# Patient Record
Sex: Male | Born: 1970 | Race: Black or African American | Hispanic: No | Marital: Married | Smoking: Former smoker
Health system: Southern US, Community
[De-identification: ages and names within clinical notes are randomized; demographics above are authoritative.]

---

## 2002-10-02 ENCOUNTER — Encounter: Payer: Self-pay | Admitting: Family Medicine

## 2002-10-02 ENCOUNTER — Encounter: Admission: RE | Admit: 2002-10-02 | Discharge: 2002-10-02 | Payer: Self-pay | Admitting: Family Medicine

## 2006-06-10 ENCOUNTER — Inpatient Hospital Stay (HOSPITAL_COMMUNITY): Admission: EM | Admit: 2006-06-10 | Discharge: 2006-06-13 | Payer: Self-pay | Admitting: Emergency Medicine

## 2006-06-11 ENCOUNTER — Encounter (INDEPENDENT_AMBULATORY_CARE_PROVIDER_SITE_OTHER): Payer: Self-pay | Admitting: Cardiology

## 2006-06-27 ENCOUNTER — Emergency Department (HOSPITAL_COMMUNITY): Admission: EM | Admit: 2006-06-27 | Discharge: 2006-06-28 | Payer: Self-pay | Admitting: Emergency Medicine

## 2008-12-05 ENCOUNTER — Emergency Department (HOSPITAL_COMMUNITY): Admission: EM | Admit: 2008-12-05 | Discharge: 2008-12-05 | Payer: Self-pay | Admitting: Emergency Medicine

## 2009-05-05 ENCOUNTER — Emergency Department (HOSPITAL_COMMUNITY): Admission: EM | Admit: 2009-05-05 | Discharge: 2009-05-05 | Payer: Self-pay | Admitting: Emergency Medicine

## 2010-07-21 NOTE — H&P (Signed)
Damon Klein, Damon Klein              ACCOUNT NO.:  [id number]   MEDICAL RECORD NO.:  [id number]          PATIENT TYPE:  INP   LOCATION:  2919                         FACILITY:  [facility]   PHYSICIAN:  Altha Harm, MDDATE OF BIRTH:  1970-04-30   DATE OF ADMISSION:  06/10/2006  DATE OF DISCHARGE:                              HISTORY & PHYSICAL   CHIEF COMPLAINT:  Failed suicide attempt.   HISTORY OF PRESENT ILLNESS:  This is a 40 year old African-American  gentleman with a history of depression and crack cocaine use who was  found by his wife today to have ingested 48 tablets of Tylenol with  diphenhydramine at approximately 1300.  Patient was brought to the  emergency room after the family contacted the poison control center.  The patient's 4 hour level was found to be 200.  We were asked to  admitted the patient for acetomorphine toxicity.  According to the  patient's wife he was in his normal state of health prior to this.  However, the patient had been seeing a therapist with the mental health  department which he stopped seeing secondary to his crack cocaine use.   PAST MEDICAL HISTORY:  Significant for depression.   FAMILY HISTORY:  Significant for hypertension.  No history of suicide in  the family.   SOCIAL HISTORY:  Patient resides with his wife and children.  He has  cocaine use.  He smokes about 1-2 cigarettes a day.  He has no alcohol  use.   ALLERGIES:  No known drug allergies.   MEDICATIONS:  Currently he is on no medications.   PRIMARY CARE PHYSICIAN:  Patient does not have a primary care physician.   REVIEW OF SYSTEMS:  The patient is disoriented and has been unable to  provide a review of systems at this time.   LABORATORY STUDIES DONE IN THE EMERGENCY ROOM:  Sodium of 135.  Potassium of 4.7.  Chloride 112.  Bicarb 24.  BUN 10.  Creatinine 1.4.  A 4 hour acetomorphine level of 200.  Alcohol level of less than 5,  salicylate level of less than 4.  AST of  24.  ALT of 22.  Alk Phos of  39.  Bilirubin of 1.0 and a lipase of 20.  White blood cell count of  2.3.  Hemoglobin of 14.7.  Platelets of 227.   PHYSICAL EXAMINATION:  VITAL SIGNS:  On my examination the patient has a  blood pressure of 153/96.  Heart rate of 81.  Respiratory rate of 26.  O2 sats are 100% on room air.  HEENT EXAMINATION:  He is normocephalic, atraumatic.  His pupils are  equally round and reactive to light and accommodation.  Extraocular  movements are intact.  Unable to cooperative with the fundus  examination.  Oropharynx is dry.  There is no exudate erythema or  lesions noted.  Nasal mucosa shows no polyps.  NECK:  Supple.  Trachea is midline.  No masses or thyromegaly.  No JVD.  No carotid bruit.  ABDOMEN:  Is soft.  He has some mild right upper quadrant tenderness.  There is  no mass.  There is no hepatosplenomegaly.  CARDIOVASCULAR:  He is mildly tachycardic.  He has a normal  S1, S2.  No murmurs, rubs or gallops are noted.  No heaves or thrills on  palpation.  LYMPH NODE SURVEY:  He has no cervical axillary inguinal lymphadenopathy  noted.  MUSCULOSKELETAL:  He has no warmth, swelling or erythema around the  joints.  He has no spinal tenderness.  NEUROLOGICAL:  His DTRs are 2+ bilaterally in the upper and lower  extremities.  Patient is unable to cooperate with the strength  examination but he is able to move all extremities against gravity.  The  patient is also unable to cooperative with the facial examination.  He  is confused and rambling verbally.  PSYCHIATRIC:  I am unable to evaluate the patient's evaluate the  patient's psychiatric condition secondary to his mental status.   ASSESSMENT AND PLAN:  The patient with:  1. Failed suicide attempt.  2. Tylenol overdose.  3. Major depression.   PLAN:  The patient will get Mucomyst 140 mg/kg loading dose and then 70  mg/kg of Mucomyst q.4 hours x17 doses.  Also obtain a UDS of this  patient to ensure that  the does not have any other illicit drugs in his  system.  The patient will be placed in the stepdown unit monitored and  will have his laboratory studies repeated again in 24 hours.  I have  spoken to the family and advised them that the patient's condition will  depend upon whether or not his Tylenol levels are coming down or going  up and how he responds to treatment.  At this time there is no baseline  liver function testing on this patient so I will get a PT, PTT on the  patient so we will have a baseline and can certainly compare to his 24  hour labs.  Patient will need a full assessment by psychiatry to  determine whether or not he needs inpatient therapy beyond this.      Altha Harm, MD  Electronically Signed     MAM/MEDQ  D:  06/10/2006  T:  06/11/2006  Job:  [id number]

## 2010-07-21 NOTE — Consult Note (Signed)
NAMEWEBER, MONNIER              ACCOUNT NO.:  [id number]   MEDICAL RECORD NO.:  [id number]          PATIENT TYPE:  INP   LOCATION:  6742                         FACILITY:  [facility]   PHYSICIAN:  Antonietta Breach, M.D.  DATE OF BIRTH:  1970/06/23   DATE OF CONSULTATION:  06/11/2006  DATE OF DISCHARGE:  06/13/2006                                 CONSULTATION   The requesting physician is Marthann Schiller.   REASON FOR CONSULTATION:  Depression and suicide attempt.   HISTORY OF PRESENT ILLNESS:  Mr. Jazion Atteberry is a 40 year old male  admitted to the [facility] on April 7 after an ingestion of  Tylenol PM in a suicide attempt.   Mr. Kellogg ingested approximately 48 tablets of Tylenol PM.  His four-  hour level of Tylenol was 200.  He was admitted to general medical  hospital to undergo a Mucomyst protocol.   Mr. Ciano describes a progressive period of depression with depressed  mood, poor concentration, poor energy, anhedonia, thoughts of  hopelessness and helplessness and progressive suicidal ideation to the  point that he intentionally ingested the above medication in an effort  to kill himself.   His greatest stress has been his progressive use of cocaine which he has  not been able to stop.  He has just recently begun some group therapy  with the goal of stopping cocaine.  He also has seen a therapist for a  couple of sessions.  He has not been on any psychotropic medication.  He  and his wife have had a number of disagreements and arguments about his  habit.   PAST PSYCHIATRIC HISTORY:  Mr. Marling describes several periods of  major depression since his late teens.  He has no history of mania.  He  has no history of hallucinations or delusions.  However, when he has  been on cocaine, he has noticed that with subsequent intoxications of  cocaine he has experienced progressive paranoia.  He does not drink  alcohol to any significant degree.   However, see the  social history below.   Mr. Fleet has never attempted suicide before.  He has no history of  psychotropic medication or prior psychiatric care.  He has no history of  psychiatric admissions.   FAMILY PSYCHIATRIC HISTORY:  None known.   SOCIAL HISTORY:  Mr. 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  He acknowledges that this  contributes to his habit when he has that money available along with the  time of day where access to cocaine is easy.   He has a 51-year-old child and has been married for 2 years.  He has been  in a regular relationship with his wife for 10 years.   The patient has a very supportive family who have come to support him in  the hospital.   GENERAL MEDICAL PROBLEMS:  Status post acetaminophen toxicity as well as  diphenhydramine toxicity due to overdose.   MEDICATIONS:  The patient is on the Mucomyst protocol.   LABORATORY DATA:  As mentioned, his Tylenol level at 4 hours post  overdose was 200.  WBC 3.4, hemoglobin 13.2, platelet 214.  INR is 1.1.  His metabolic panel has shown his BUN to be 5, creatinine 0.93.  His  SGOT and SGPT have not been elevated.  His albumin is at 3.2.  Calcium  is normal.  His ASA level was negative.  His urine drug screen was  positive for marijuana as well as cocaine.  The patient did acknowledge  using marijuana as well.   Alcohol was negative.   REVIEW OF SYSTEMS:  Noncontributory examination.   MENTAL STATUS EXAM:  Mr. Kotowski is an alert male appearing his  chronologic age.  He is oriented to all spheres.  He is polite and  socially appropriate.  His memory is intact to immediate, recent and  remote.  His fund of knowledge and intelligence are above average.  His  speech involves normal rate and prosody.  He is well-groomed.   Thought  process:  Logical, coherent, goal-directed.  No looseness of  associations.  Thought content:  No thoughts of harming himself.  No  thoughts of harming others.  No delusions, no hallucinations.  He has an  appropriate level of regret about the suicide attempt and wants to  rehabilitate from his chemical dependency.  His concentration is  decreased.  His affect is constricted.  He has tears off and on through  the interview.  His mood is depressed.  His insight is partial.  His  judgment is intact.   ASSESSMENT:  AXIS I:  Major depressive disorder recurrent, severe.  Cocaine dependence.  Cannabis abuse.  AXIS II:  Deferred.  AXIS III:  See general medical problems.  The patient does not have a  history of seizures.  AXIS IV:  Primary support group.  AXIS V:  40.   Mr. Mullen is not at risk to harm himself in the hospital.  However, he  does have a high risk of reusing cocaine which could result in a  destabilization of his mental status and possible return to suicidal  state.  Therefore, the undersigned recommends inpatient dual diagnosis  treatment while undergoing the Mucomyst protocol.   The undersigned provided ego supportive psychotherapy and education.   The indications, alternatives and adverse effects of Wellbutrin were  discussed with the patient for antidepression including the risk of  seizures.  She understands the above information and wants to start  Wellbutrin.   RECOMMENDATIONS:  1. Would start Wellbutrin SR at 100 mg p.o. q.a.m.  Would then      increase to 150 mg SR p.o. b.i.d. at 8:00 a.m. and 4:00 p.m. over      the next 5 days.  2. Would admit to a dual diagnosis psychiatric program while      undergoing the Mucomyst protocol.  The patient does not need a      sitter in the hospital.   Mr. Bolds does agree to call his emergency services if he redevelops suicidal thoughts, thoughts of harming others or distress.      Antonietta Breach, M.D.   Electronically Signed     JW/MEDQ  D:  06/15/2006  T:  06/15/2006  Job:  [id number]

## 2010-07-21 NOTE — Consult Note (Signed)
NAMEALDER, MURRI              ACCOUNT NO.:  [id number]   MEDICAL RECORD NO.:  [id number]          PATIENT TYPE:  INP   LOCATION:  6742                         FACILITY:  [facility]   PHYSICIAN:  Antonietta Breach, M.D.  DATE OF BIRTH:  May 12, 1970   DATE OF CONSULTATION:  06/16/2006  DATE OF DISCHARGE:  06/13/2006                                 CONSULTATION   Mr. Goeller has improved.  His interests have now returned to normal.  He has had time to reflect and receive support from friends and family  members.  He has given great thought to his recovery program.  He has no  thoughts of harming himself or others.  He has no hallucinations or  delusions.  His energy is now at approximately 80% of normal.  Concentration is normal.  He is not having any adverse Wellbutrin  effects.  He is not having any cocaine craving.  He is socially  appropriate and cooperative with health care staff.   MENTAL STATUS EXAM:  Mr. Ciotti is alert.  He is oriented to all  spheres.  His speech is within normal limits.  His thought process is  logical, coherent, goal-directed.  No looseness of associations.  Thought content:  No thoughts of harming himself.  No thoughts of  harming others.  No delusions, no hallucinations.  Memory is within  normal limits.  Concentration is within normal limits.  Affect is mildly  constricted at baseline but with a broad and appropriate response.  His  mood is within normal limits.  His insight is good.  His judgment is  within normal limits.   ASSESSMENT:  1. Major depressive disorder, recurrent.  2. Cocaine dependence.  3. Cannabis abuse.   The undersigned provided followup, ego supportive psychotherapy and an  introduction to the principles of cognitive behavioral therapy.  The  undersigned also discussed standard chemical dependency rehabilitation  tools.   The undersigned continued to recommend an inpatient chemical dependency  rehabilitation program preceded by a  dual diagnosis admission.  However,  the patient declined and he is no longer committable.  He points out  that he is motivated for 12-step groups as well as standard outpatient  psychiatric treatment.  He also is receiving information about chemical  dependency intensive outpatient programs within his region.  He wants to  proceed with an outpatient regimen so that he can pursue a new job and  continue his college studies.   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   RECOMMENDATIONS:  1. Would increase the Wellbutrin to 150 mg SR p.o. b.i.d. at 8:00 a.m.      and 4:00 p.m.  2. The the patient will pursue outpatient treatment as described      above.  The social worker is meeting with him to help facilitate      his enrollment in those programs.   At the patient's request, the undersigned met with his mother  and  brother to provide education and facilitate support.  Al-Anon and  [facility] were recommended to them.      Antonietta Breach, M.D.  Electronically Signed     JW/MEDQ  D:  06/16/2006  T:  06/16/2006  Job:  [id number]

## 2010-07-21 NOTE — Discharge Summary (Signed)
Damon Klein, Damon Klein              ACCOUNT NO.:  [id number]   MEDICAL RECORD NO.:  [id number]          PATIENT TYPE:  INP   LOCATION:  2919                         FACILITY:  [facility]   PHYSICIAN:  Michaelyn Barter, M.D. DATE OF BIRTH:  1970/06/06   DATE OF ADMISSION:  06/10/2006  DATE OF DISCHARGE:  06/12/2006                               DISCHARGE SUMMARY   STAT DISCHARGE SUMMARY   FINAL DIAGNOSES:  1. Attempted suicide.  2. Acetaminophen overdose.  3. Hypoalbumin.  4. Crack cocaine abuse.  5. Major depression.  6. Urine drug screen positive for cocaine and marijuana.   PROCEDURES:  1. Portable abdominal x-ray completed June 10, 2006.  2. Two-D echocardiogram completed June 11, 2006.   CONSULTATIONS:  Psychiatry with Dr. Jeanie Sewer.   HISTORY OF PRESENT ILLNESS:  Damon Klein is a 40 year old gentleman, who  was found by his wife on the date of admission.  It was reported that he  ingested 48 Tylenol tablets with diphenhydramine at approximately 1  o'clock p.m.  The [facility] was contacted, and the family  was directed to bring the patient to the hospital for further  evaluation.  The patient's wife indicated that the patient had been  seeing a therapist with the [facility], and there appeared  to be a break in the sessions secondary to the patient's ongoing crack  cocaine usage.  For past medical history, please see that dictated by  Dr. Marthann Schiller.   HOSPITAL COURSE:  1. Suicide attempt with acetaminophen:  The patient was admitted into      the Step-Down Unit for close observation.  He remained      hemodynamically stable over the course of his hospitalization.  By      his third day of his hospitalization, his mentation appeared to be      stable and he appeared to be back to his baseline.  There are no      documented issues regarding his respiratory status.  2. Acetaminophen toxicity secondary to the ingestion of the large  amount of Tylenol tablets:  The patient was started on Mucomyst.      His acetaminophen level at the time of his admission was found to      be 400.  Over the course of his hospitalization, this number      declined to less than 10 by June 12, 2006.  The patient has not      complained of any abdominal pain over the course of his      hospitalization.  3. Questionable EKG changes:  The patient has not complained of any      chest pain throughout the course of his hospitalization nor has      there been any documented shortness of breath during the course of      his hospitalization.  The patient has had several EKGs completed      throughout the course of hospitalization, and of the many EKGs that      were completed, none of them had a definite abnormality.  There did  appear to be some early repolarization, but given the fact that the      patient is an African-American, slim-built male, the significance      of this is questionable.  A two-D echocardiogram was completed on      June 11, 2006, and it revealed overall left ventricular systolic      function to be normal.  The left ventricular EF was estimated to be      55 to 65%.  There were no left ventricular regional wall motion      abnormalities.  The left ventricular diastolic function parameters      were normal.  Again, the patient has not complained of any chest      pain or shortness of breath over the course of his hospitalization.  4. Major depression:  Psychiatry has been consulted.  Dr. Jeanie Sewer      has seen the patient.  He indicated that the patient should be      started on Wellbutrin 100 mg Sustained Release q.a.m. and Ambien 2      mg just p.r.n. for insomnia.  5. Hypoalbumin:  The patient's albumin level was noted to be 3.0.  His      total protein is 5.3.  There may be a mild element of protein-      calorie malnutrition present.  The patient has been encouraged to      eat regularly scheduled meals.    CONDITION AT TIME OF DISCHARGE:  Today, the patient states that he feels  better.  He denies having any nausea, vomiting, fevers or chills, no  abdominal pain, no obvious distress.  His vitals:  His temperature was  98.3, heart rate 62, blood pressure 136/85, respirations 16, O2 SAT 95%  on room air.  His PT is 15.5, INR 1.2, sodium 138, potassium 4.1,  chloride 107, CO2 26, BUN 12, creatinine 0.93, glucose 72, bilirubin  total 0.6, alk-phos 40, SGOT 15, SGPT 19, total protein 5.3, albumin  3.0, calcium 8.5.  The decision has been made to discharge the patient  from the hospital to [facility].  The patient's discharge  medication will consist of Wellbutrin 100 mg p.o. daily as well as  Ambien 5 mg p.o. q.h.s. p.r.n.      Michaelyn Barter, M.D.  Electronically Signed     OR/MEDQ  D:  06/12/2006  T:  06/12/2006  Job:  [id number]

## 2010-11-23 IMAGING — CR DG ANKLE COMPLETE 3+V*L*
3 series · 3 of 3 positions shown · non-contrast
Comparison: None available.

CLINICAL DATA: Left ankle injury.  Pain.

LEFT ANKLE COMPLETE - 3+ VIEW

[t ankle joint ap left]
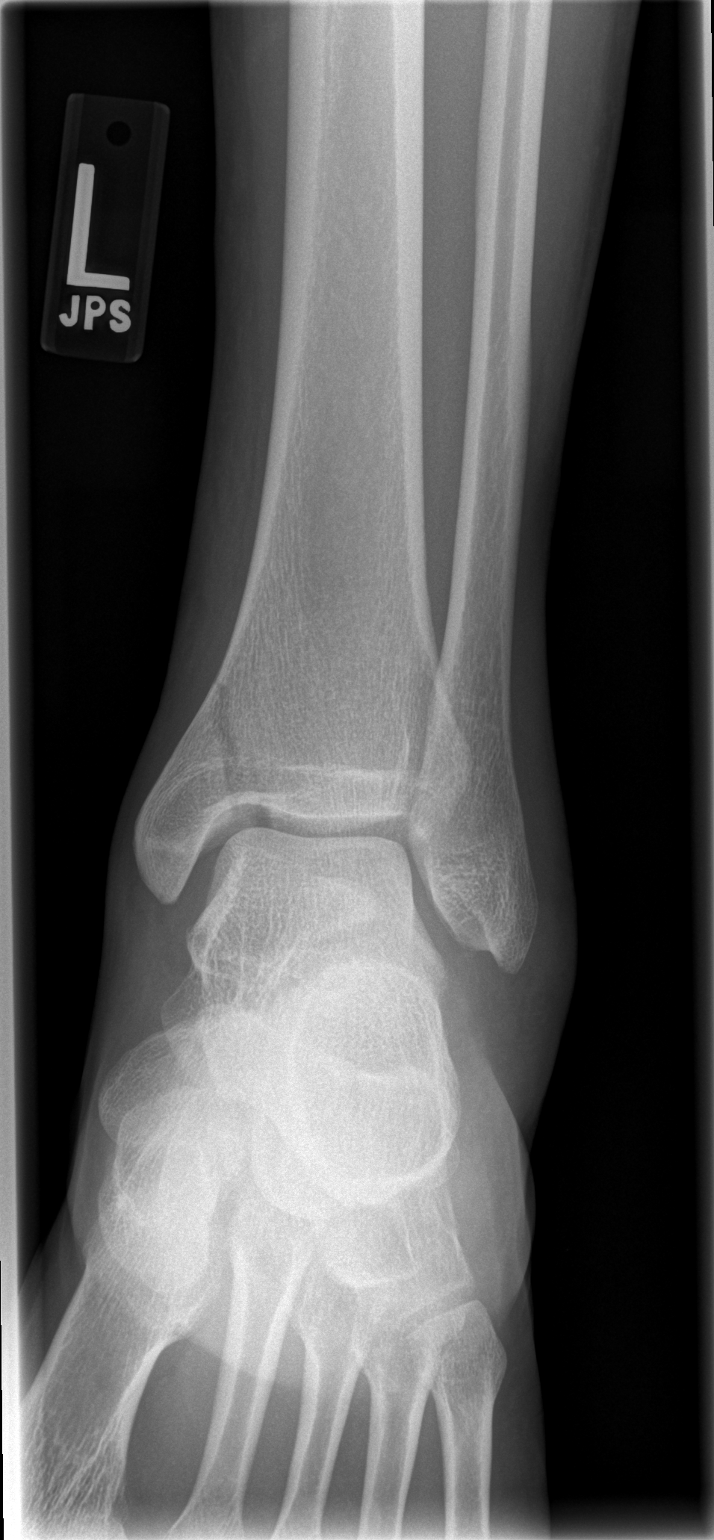

[t ankle joint oblique left]
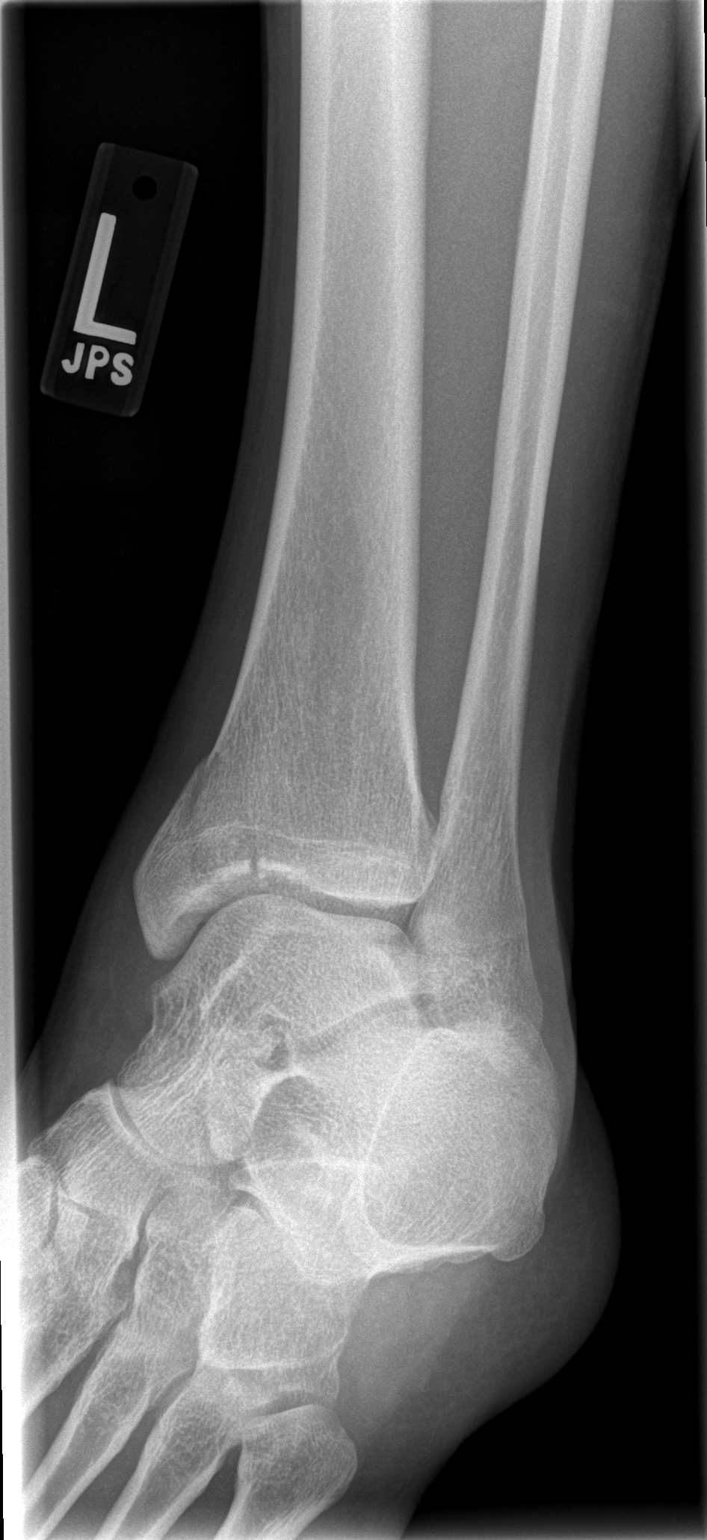

[t ankle joint lat left]
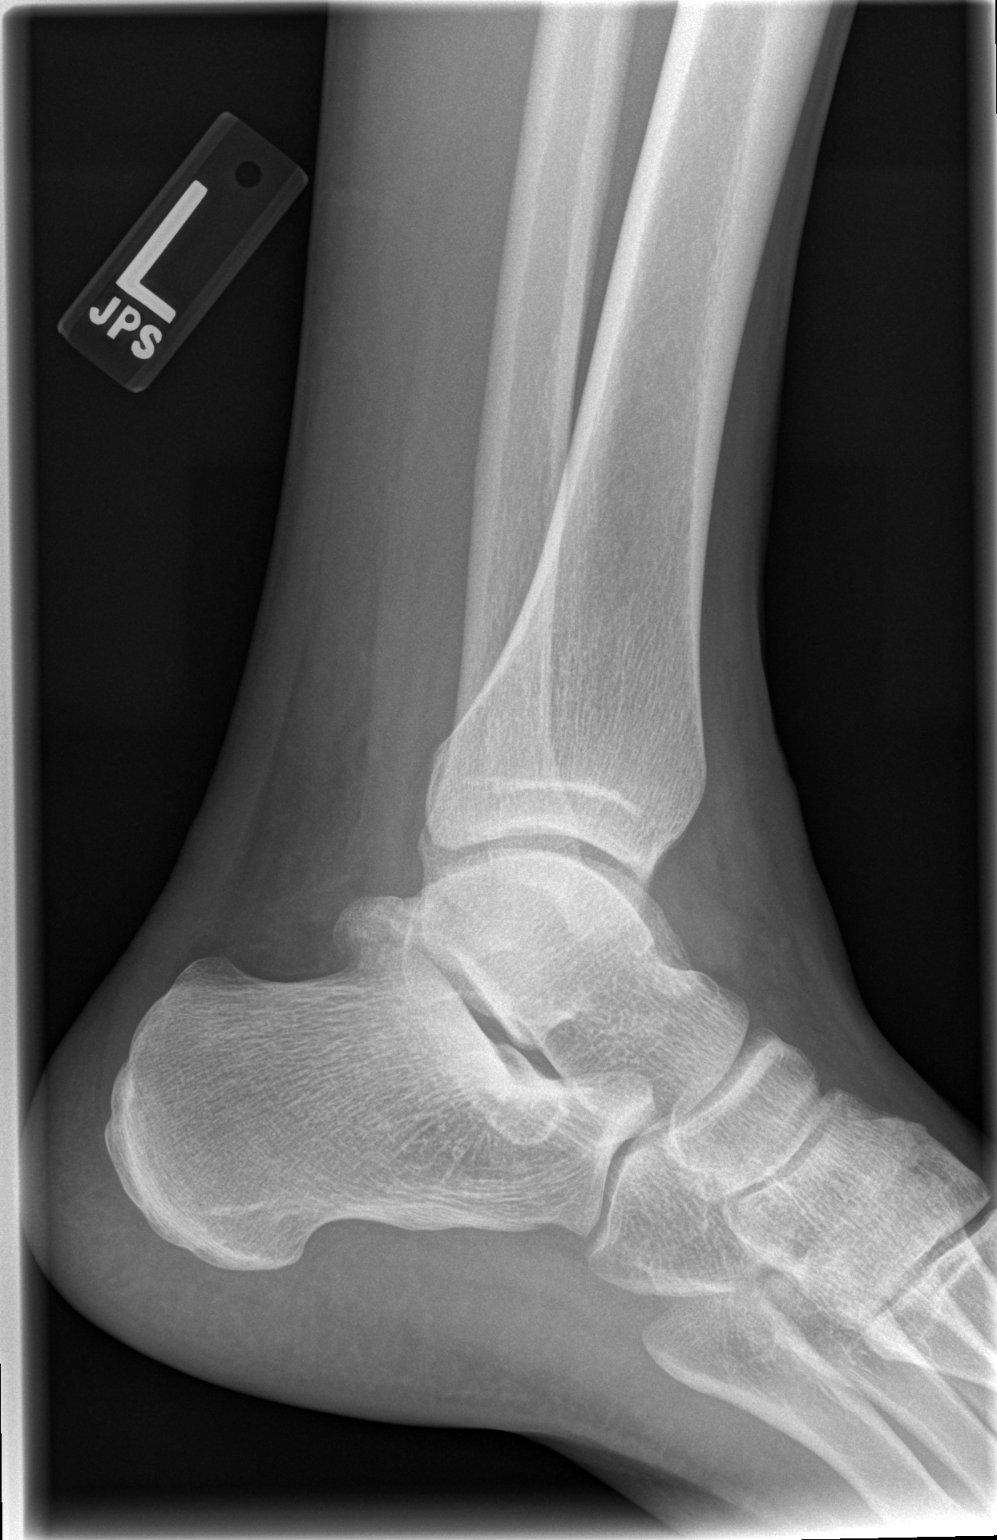

[3 of 3 positions shown; findings below may reference images not displayed]

FINDINGS: The patient has a nondisplaced medial malleolar fracture
with associated soft tissue swelling.  No other acute bony or joint
abnormality is identified.
IMPRESSION: Nondisplaced medial malleolar fracture.

## 2016-10-02 DIAGNOSIS — Z23 Encounter for immunization: Secondary | ICD-10-CM | POA: Diagnosis not present

## 2016-10-02 DIAGNOSIS — Z13220 Encounter for screening for lipoid disorders: Secondary | ICD-10-CM | POA: Diagnosis not present

## 2016-10-02 DIAGNOSIS — Z125 Encounter for screening for malignant neoplasm of prostate: Secondary | ICD-10-CM | POA: Diagnosis not present

## 2016-10-02 DIAGNOSIS — Z0000 Encounter for general adult medical examination without abnormal findings: Secondary | ICD-10-CM | POA: Diagnosis not present

## 2018-11-25 DIAGNOSIS — F411 Generalized anxiety disorder: Secondary | ICD-10-CM | POA: Diagnosis not present

## 2018-12-10 DIAGNOSIS — F411 Generalized anxiety disorder: Secondary | ICD-10-CM | POA: Diagnosis not present

## 2018-12-17 DIAGNOSIS — F411 Generalized anxiety disorder: Secondary | ICD-10-CM | POA: Diagnosis not present

## 2020-02-10 ENCOUNTER — Other Ambulatory Visit: Payer: Self-pay

## 2020-02-10 ENCOUNTER — Encounter (HOSPITAL_COMMUNITY): Payer: Self-pay | Admitting: Emergency Medicine

## 2020-02-10 ENCOUNTER — Emergency Department (HOSPITAL_COMMUNITY)
Admission: EM | Admit: 2020-02-10 | Discharge: 2020-02-11 | Disposition: A | Discharge status: Home / Self Care | Payer: Self-pay | Attending: Emergency Medicine | Admitting: Emergency Medicine

## 2020-02-10 DIAGNOSIS — Z5321 Procedure and treatment not carried out due to patient leaving prior to being seen by health care provider: Secondary | ICD-10-CM | POA: Insufficient documentation

## 2020-02-10 DIAGNOSIS — R519 Headache, unspecified: Secondary | ICD-10-CM | POA: Insufficient documentation

## 2020-02-10 DIAGNOSIS — R112 Nausea with vomiting, unspecified: Secondary | ICD-10-CM | POA: Insufficient documentation

## 2020-02-10 DIAGNOSIS — Y92410 Unspecified street and highway as the place of occurrence of the external cause: Secondary | ICD-10-CM | POA: Insufficient documentation

## 2020-02-10 MED ORDER — ONDANSETRON 4 MG PO TBDP: 4.0000 mg | ORAL_TABLET | Freq: Once | ORAL | Status: DC | PRN

## 2020-02-10 NOTE — ED Notes (Signed)
PT CALLED 1X FOR BLOOD DRAW, NO RESPONSE
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
## 2020-02-15 ENCOUNTER — Other Ambulatory Visit: Payer: Self-pay

## 2020-02-15 ENCOUNTER — Emergency Department (HOSPITAL_COMMUNITY): Payer: Self-pay

## 2020-02-15 ENCOUNTER — Emergency Department (HOSPITAL_COMMUNITY)
Admission: EM | Admit: 2020-02-15 | Discharge: 2020-02-16 | Disposition: A | Discharge status: Home / Self Care | Payer: Self-pay | Attending: Emergency Medicine | Admitting: Emergency Medicine

## 2020-02-15 ENCOUNTER — Encounter (HOSPITAL_COMMUNITY): Payer: Self-pay

## 2020-02-15 DIAGNOSIS — Z87891 Personal history of nicotine dependence: Secondary | ICD-10-CM | POA: Insufficient documentation

## 2020-02-15 DIAGNOSIS — Y92810 Car as the place of occurrence of the external cause: Secondary | ICD-10-CM | POA: Insufficient documentation

## 2020-02-15 DIAGNOSIS — S06360A Traumatic hemorrhage of cerebrum, unspecified, without loss of consciousness, initial encounter: Secondary | ICD-10-CM | POA: Insufficient documentation

## 2020-02-15 DIAGNOSIS — Y9339 Activity, other involving climbing, rappelling and jumping off: Secondary | ICD-10-CM | POA: Insufficient documentation

## 2020-02-15 DIAGNOSIS — S0219XA Other fracture of base of skull, initial encounter for closed fracture: Secondary | ICD-10-CM | POA: Insufficient documentation

## 2020-02-15 DIAGNOSIS — I629 Nontraumatic intracranial hemorrhage, unspecified: Secondary | ICD-10-CM

## 2020-02-15 DIAGNOSIS — W1789XA Other fall from one level to another, initial encounter: Secondary | ICD-10-CM | POA: Insufficient documentation

## 2020-02-15 MED ORDER — ONDANSETRON 4 MG PO TBDP
4.0000 mg | ORAL_TABLET | Freq: Once | ORAL | Status: AC
  Administered 2020-02-15: 4 mg via ORAL
  Filled 2020-02-15: qty 1

## 2020-02-15 MED ORDER — OXYCODONE-ACETAMINOPHEN 5-325 MG PO TABS
1.0000 | ORAL_TABLET | Freq: Once | ORAL | Status: AC
  Administered 2020-02-15: 1 via ORAL
  Filled 2020-02-15: qty 1
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
## 2020-02-15 NOTE — ED Notes (Signed)
Woman called to speak with this nurse about patient in [place].  Caller was cursing at this nurse and upset that the patient was given Percocet for his emergency medical condition.  This nurse explained that I could not give out specific medical information with anyone without the patient's permission.  Caller became angry and demanded to know why patient was given narcotics and stated that he had a drug problem.  This nurse stated that he was ending the call due to excessive cursing and disrespectful nature of the conversation.  Pt text messaging someone shortly after the call.  Will continue to monitor.
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
## 2020-02-16 NOTE — ED Provider Notes (Signed)
Care assumed at shift change from Dr. Effie Shy.  At the time of signout, we are awaiting a return call from neurosurgery to obtain their recommendations.  Dr. Dutch Quint has reviewed the imaging studies and does not feel as though anything emergently surgical is present.  Patient to be discharged with follow-up with neurosurgery in 2 weeks.  Patient will be given work excuse and is to return to the ER as needed.   Geoffery Lyons, MD 02/16/20 [phone]

## 2020-02-16 NOTE — Discharge Instructions (Signed)
Take ibuprofen 600 mg every 6 hours as needed for pain.  You are to follow-up in the neurosurgery clinic in 2 weeks.  The contact information for Dr. Jordan Likes has been provided in this discharge summary for you to call and make these arrangements.  Return to the emergency department in the meantime if you develop worsening headache, seizure activity, or other new and concerning symptoms.

## 2021-01-05 ENCOUNTER — Other Ambulatory Visit: Payer: Self-pay

## 2021-01-05 ENCOUNTER — Ambulatory Visit (HOSPITAL_COMMUNITY)
Admission: EM | Admit: 2021-01-05 | Discharge: 2021-01-05 | Disposition: A | Discharge status: Home / Self Care | Payer: Managed Care, Other (non HMO) | Attending: Internal Medicine | Admitting: Internal Medicine

## 2021-01-05 DIAGNOSIS — S29012A Strain of muscle and tendon of back wall of thorax, initial encounter: Secondary | ICD-10-CM | POA: Diagnosis not present

## 2021-01-05 MED ORDER — KETOROLAC TROMETHAMINE 30 MG/ML IJ SOLN
INTRAMUSCULAR | Status: AC
  Filled 2021-01-05: qty 1

## 2021-01-05 MED ORDER — METHOCARBAMOL 500 MG PO TABS: 500.0000 mg | ORAL_TABLET | Freq: Every evening | ORAL | 0 refills | Status: AC | PRN

## 2021-01-05 MED ORDER — KETOROLAC TROMETHAMINE 30 MG/ML IJ SOLN
30.0000 mg | Freq: Once | INTRAMUSCULAR | Status: AC
  Administered 2021-01-05: 30 mg via INTRAMUSCULAR

## 2021-01-05 MED ORDER — METHOCARBAMOL 500 MG PO TABS: 500.0000 mg | ORAL_TABLET | Freq: Every evening | ORAL | 0 refills | Status: DC | PRN

## 2021-01-05 NOTE — Discharge Instructions (Addendum)
Gentle stretching exercises Take medications as prescribed Continue taking ibuprofen as needed for pain Heating pad use on a 20-minute on-20 minutes off cycle Do not operate a vehicle or heavy machinery after taking muscle relaxants. Return to urgent care if symptoms worsen.

## 2021-01-05 NOTE — ED Triage Notes (Signed)
Pt is present today with upper back pain that started x5 days ago

## 2021-01-08 NOTE — ED Provider Notes (Signed)
[facility]    CSN: [id number] Arrival date & time: 01/05/21  1839      History   Chief Complaint Chief Complaint  Patient presents with   Back Pain    HPI Damon Klein is a 50 y.o. male comes to the urgent care with 5-day history of left upper back pain.  Pain is sharp, throbbing and severe.  It is aggravated by movement.  Patient has used heating pad with no significant improvement in pain.  Associated with some stiffness in the back.  No heavy lifting or trauma.  Patient used heating pad for about 3 hours prior yesterday.  No bruising or masses.   HPI  No past medical history on file.  There are no problems to display for this patient.   No past surgical history on file.     Home Medications    Prior to Admission medications   Medication Sig Start Date End Date Taking? Authorizing Provider  methocarbamol (ROBAXIN) 500 MG tablet Take 1 tablet (500 mg total) by mouth at bedtime as needed for muscle spasms. 01/05/21   Thalia Turkington, Britta Mccreedy, MD    Family History Family History  Problem Relation Age of Onset   Diabetes Father     Social History Social History   Tobacco Use   Smoking status: Former    Types: Cigarettes   Smokeless tobacco: Never  Vaping Use   Vaping Use: Never used  Substance Use Topics   Alcohol use: Never   Drug use: Never     Allergies   Patient has no known allergies.   Review of Systems Review of Systems  Musculoskeletal:  Positive for back pain and myalgias. Negative for gait problem.  Skin: Negative.     Physical Exam Triage Vital Signs ED Triage Vitals  Enc Vitals Group     BP 01/05/21 1954 113/74     Pulse Rate 01/05/21 1954 79     Resp 01/05/21 1954 18     Temp 01/05/21 1955 98.2 F (36.8 C)     Temp src --      SpO2 01/05/21 1954 100 %     Weight --      Height --      Head Circumference --      Peak Flow --      Pain Score 01/05/21 1954 7     Pain Loc --      Pain Edu? --      Excl. in GC? --     No data found.  Updated Vital Signs BP 113/74   Pulse 79   Temp 98.2 F (36.8 C)   Resp 18   SpO2 100%   Visual Acuity Right Eye Distance:   Left Eye Distance:   Bilateral Distance:    Right Eye Near:   Left Eye Near:    Bilateral Near:     Physical Exam Vitals and nursing note reviewed.  Constitutional:      General: He is not in acute distress.    Appearance: Normal appearance. He is not ill-appearing.  Cardiovascular:     Rate and Rhythm: Normal rate and regular rhythm.  Musculoskeletal:     Comments: Tenderness on palpation over the left upper back area just medial to the medial edge of the scapula.  No bruising noted.  Neurological:     Mental Status: He is alert.     UC Treatments / Results  Labs (all labs ordered are listed, but only abnormal  results are displayed) Labs Reviewed - No data to display  EKG   Radiology No results found.  Procedures Procedures (including critical care time)  Medications Ordered in UC Medications  ketorolac (TORADOL) 30 MG/ML injection 30 mg (30 mg Intramuscular Given 01/05/21 2050)    Initial Impression / Assessment and Plan / UC Course  I have reviewed the triage vital signs and the nursing notes.  Pertinent labs & imaging results that were available during my care of the patient were reviewed by me and considered in my medical decision making (see chart for details).     Muscle strain of the left upper back. Gentle range of motion exercises Gentle stretching exercises Robaxin at bedtime as needed for muscle stiffness Continue ibuprofen Return to urgent care if symptoms worsen. Final Clinical Impressions(s) / UC Diagnoses   Final diagnoses:  Muscle strain of left upper back, initial encounter     Discharge Instructions      Gentle stretching exercises Take medications as prescribed Continue taking ibuprofen as needed for pain Heating pad use on a 20-minute on-20 minutes off cycle Do not operate a  vehicle or heavy machinery after taking muscle relaxants. Return to urgent care if symptoms worsen.   ED Prescriptions     Medication Sig Dispense Auth. Provider   methocarbamol (ROBAXIN) 500 MG tablet  (Status: Discontinued) Take 1 tablet (500 mg total) by mouth at bedtime as needed for muscle spasms. 10 tablet Christopherjame Carnell, Britta Mccreedy, MD   methocarbamol (ROBAXIN) 500 MG tablet Take 1 tablet (500 mg total) by mouth at bedtime as needed for muscle spasms. 10 tablet Ahlana Slaydon, Britta Mccreedy, MD      PDMP not reviewed this encounter.   Merrilee Jansky, MD 01/08/21 1450

## 2021-04-26 ENCOUNTER — Telehealth (HOSPITAL_COMMUNITY): Payer: Self-pay | Admitting: Licensed Clinical Social Worker

## 2021-04-26 NOTE — Telephone Encounter (Signed)
Counselor attempts to outreach the client in response to a faxed referral from [facility] which includes a Release of Information. Counselor leaves a HIPAA-compliant voicemail with his callback number which is [phone].  [name], MA, LCSW, LCMHC, LCAS

## 2021-04-26 NOTE — Telephone Encounter (Signed)
The Counselor calls Ms. Damon Klein, Client Care Manager with [facility] regarding the Client Referral Form that she sent. The Counselor lets her know that he attempted to reach the client today leaving a HIPAA-compliant voicemail.  Ms. Damon Klein says that the client is attending their PHP with boarding in [place] and will discharge next Monday and will return to [place]. She says that he has been in their program for approximately 30 days. When he returns to [place], he will reside at either an [address] or [facility] [employer]. He has an interview with an [employer].  She says that she referred the client for medication management so he can continue taking his psychotropic medications. She indicates that he will likely also need individual and family therapy with Ms. Boler not being aware of the services provided by [facility] at this location.  The Counselor makes Ms. Boler aware of the outpatient therapy services provided at [facility] and also makes her aware of the [facility] a this office. She says that she will meet with the client today at 11 a.m. to discuss aftercare therapy options. As a Release of Information is in place, the Counselor asks that Ms. Boler fax any clinical information that she can in advance of the client being seen for a CCA at this location.  The Counselor provides his direct callback number [phone] and email, [email or web address]. Ms. Damon Klein and/or the client will call this Counselor back to schedule.   [name], MA, LCSW, LCMHC, LCAS

## 2021-04-27 ENCOUNTER — Telehealth (HOSPITAL_COMMUNITY): Payer: Self-pay | Admitting: Licensed Clinical Social Worker

## 2021-04-27 NOTE — Telephone Encounter (Signed)
The Counselor receives the following email from Ms. Jilda Roche, Client Care Manager at [facility]:   "[email or web address] Kuper Rennels, Chrissie Noa *Caution - External email - see footer for warnings*  Hello!  We spoke on the phone a few days ago regarding client, Damon Klein about IOP services. He has denied the IOP due to his work schedule conflicting but he is definitely interested in your individual or family counseling if that is an option.  You can give me a call or email back. Whichever is easier for you. Hope to chat soon. Thank you!  -- Jilda Roche Client Care Manager [facility] [phone], Option 5"  Counselor calls Ms. Boler in response to her email. She says that th client is interested in in-person therapy; however, she does not know his work hours as of yet. The Counselor provides her with the OP therapy office hours. She says that sh meets with him today and will see if these hours will work for him so as to not conflict with his work schedule. She says that she has already scheduled a Psychiatric Evaluation for him at this office.  [name], MA, LCSW, LCMHC, LCAS

## 2021-05-08 ENCOUNTER — Telehealth (HOSPITAL_COMMUNITY): Payer: Self-pay | Admitting: Licensed Clinical Social Worker

## 2021-05-08 NOTE — Telephone Encounter (Signed)
The therapist returns the client's call leaving a HIPAA-compliant message with his callback number which is [phone]. ? ?Adam Phenix, MA, LCSW, [place], LCAS ?

## 2021-05-08 NOTE — Telephone Encounter (Signed)
The client calls the therapist back and the therapist verifies the client's identity via his full name, DOB, and address on file. The therapist explains the reason for his call a week ago and his conversations with Ms. Boler.  ? ?The client says that he has a tele-psychiatry appointment on the 9th of this month with this office and that he has found a therapist at a different location with whom he can meet such that his aftercare needs are being met. ? ?The therapist encourages him to call him back at [phone] should any questions or concerns arise. ? ?Adam Phenix, MA, LCSW, [place], LCAS ?

## 2021-05-08 NOTE — Telephone Encounter (Signed)
The therapist receives a voicemail from the client noting that the therapist was trying to get in touch with him last week "at the behest of [employer]" adding that he does not know what it is about; however, that he is returning the therapist's call.  ? ?[name], MA, LCSW, LCMHC, LCAS ?

## 2021-06-01 ENCOUNTER — Telehealth (HOSPITAL_COMMUNITY): Payer: Self-pay | Admitting: Psychiatry

## 2021-08-29 ENCOUNTER — Telehealth (HOSPITAL_COMMUNITY): Payer: Managed Care, Other (non HMO) | Admitting: Psychiatry

## 2021-11-01 ENCOUNTER — Ambulatory Visit: Payer: Managed Care, Other (non HMO) | Admitting: Nurse Practitioner

## 2021-11-08 ENCOUNTER — Ambulatory Visit (HOSPITAL_COMMUNITY): Payer: Managed Care, Other (non HMO) | Admitting: Psychiatry

## 2021-11-08 ENCOUNTER — Encounter (HOSPITAL_COMMUNITY): Payer: Self-pay

## 2021-11-13 ENCOUNTER — Telehealth (HOSPITAL_COMMUNITY): Payer: Self-pay | Admitting: Psychiatry

## 2021-11-13 NOTE — Telephone Encounter (Signed)
Patient called stating he had missed his last scheduled appointment and wanted to reschedule. Review of appointment note from last no-show on 11/08/2021 stipulated patient was not to be rescheduled per provider. Informed patient of this and he hung up.

## 2021-12-22 ENCOUNTER — Ambulatory Visit: Payer: Managed Care, Other (non HMO) | Admitting: Nurse Practitioner

## 2023-02-13 DIAGNOSIS — F1421 Cocaine dependence, in remission: Secondary | ICD-10-CM | POA: Diagnosis not present

## 2023-02-13 DIAGNOSIS — I10 Essential (primary) hypertension: Secondary | ICD-10-CM | POA: Diagnosis not present

## 2023-02-13 DIAGNOSIS — E785 Hyperlipidemia, unspecified: Secondary | ICD-10-CM | POA: Diagnosis not present

## 2023-02-13 DIAGNOSIS — F319 Bipolar disorder, unspecified: Secondary | ICD-10-CM | POA: Diagnosis not present

## 2023-02-13 DIAGNOSIS — F1021 Alcohol dependence, in remission: Secondary | ICD-10-CM | POA: Diagnosis not present

## 2023-02-13 DIAGNOSIS — Z809 Family history of malignant neoplasm, unspecified: Secondary | ICD-10-CM | POA: Diagnosis not present

## 2023-02-13 DIAGNOSIS — Z87891 Personal history of nicotine dependence: Secondary | ICD-10-CM | POA: Diagnosis not present

## 2023-02-13 DIAGNOSIS — R32 Unspecified urinary incontinence: Secondary | ICD-10-CM | POA: Diagnosis not present

## 2023-02-13 DIAGNOSIS — Z833 Family history of diabetes mellitus: Secondary | ICD-10-CM | POA: Diagnosis not present

## 2023-02-13 DIAGNOSIS — Z8249 Family history of ischemic heart disease and other diseases of the circulatory system: Secondary | ICD-10-CM | POA: Diagnosis not present

## 2023-03-17 DIAGNOSIS — Z79899 Other long term (current) drug therapy: Secondary | ICD-10-CM | POA: Diagnosis not present

## 2023-03-23 DIAGNOSIS — Z79899 Other long term (current) drug therapy: Secondary | ICD-10-CM | POA: Diagnosis not present

## 2023-03-30 DIAGNOSIS — Z79899 Other long term (current) drug therapy: Secondary | ICD-10-CM | POA: Diagnosis not present

## 2023-04-07 DIAGNOSIS — Z79899 Other long term (current) drug therapy: Secondary | ICD-10-CM | POA: Diagnosis not present

## 2023-07-04 DIAGNOSIS — F339 Major depressive disorder, recurrent, unspecified: Secondary | ICD-10-CM | POA: Diagnosis not present

## 2023-07-04 DIAGNOSIS — Z87898 Personal history of other specified conditions: Secondary | ICD-10-CM | POA: Diagnosis not present

## 2023-07-04 DIAGNOSIS — E785 Hyperlipidemia, unspecified: Secondary | ICD-10-CM | POA: Diagnosis not present

## 2023-11-07 DIAGNOSIS — Z13220 Encounter for screening for lipoid disorders: Secondary | ICD-10-CM | POA: Diagnosis not present

## 2023-11-07 DIAGNOSIS — F319 Bipolar disorder, unspecified: Secondary | ICD-10-CM | POA: Diagnosis not present

## 2023-11-07 DIAGNOSIS — Z1211 Encounter for screening for malignant neoplasm of colon: Secondary | ICD-10-CM | POA: Diagnosis not present

## 2023-11-07 DIAGNOSIS — Z87898 Personal history of other specified conditions: Secondary | ICD-10-CM | POA: Diagnosis not present

## 2023-12-03 DIAGNOSIS — Z1159 Encounter for screening for other viral diseases: Secondary | ICD-10-CM | POA: Diagnosis not present

## 2023-12-03 DIAGNOSIS — Z87898 Personal history of other specified conditions: Secondary | ICD-10-CM | POA: Diagnosis not present

## 2023-12-03 DIAGNOSIS — Z113 Encounter for screening for infections with a predominantly sexual mode of transmission: Secondary | ICD-10-CM | POA: Diagnosis not present

## 2023-12-03 DIAGNOSIS — Z129 Encounter for screening for malignant neoplasm, site unspecified: Secondary | ICD-10-CM | POA: Diagnosis not present
